# Patient Record
Sex: Male | Born: 1980 | Hispanic: Yes | Marital: Married
Health system: Southern US, Community
[De-identification: ages and names within clinical notes are randomized; demographics above are authoritative.]

---

## 2021-04-05 ENCOUNTER — Other Ambulatory Visit: Payer: Self-pay

## 2021-04-05 ENCOUNTER — Encounter (HOSPITAL_COMMUNITY): Payer: Self-pay | Admitting: Emergency Medicine

## 2021-04-05 ENCOUNTER — Emergency Department (HOSPITAL_COMMUNITY)
Admission: EM | Admit: 2021-04-05 | Discharge: 2021-04-06 | Disposition: A | Discharge status: Home / Self Care | Payer: Self-pay | Attending: Emergency Medicine | Admitting: Emergency Medicine

## 2021-04-05 DIAGNOSIS — R1013 Epigastric pain: Secondary | ICD-10-CM | POA: Insufficient documentation

## 2021-04-05 DIAGNOSIS — R109 Unspecified abdominal pain: Secondary | ICD-10-CM

## 2021-04-05 DIAGNOSIS — R1110 Vomiting, unspecified: Secondary | ICD-10-CM | POA: Insufficient documentation

## 2021-04-05 LAB — URINALYSIS, ROUTINE W REFLEX MICROSCOPIC
Bilirubin Urine: NEGATIVE
Glucose, UA: NEGATIVE mg/dL
Hgb urine dipstick: NEGATIVE
Ketones, ur: NEGATIVE mg/dL
Leukocytes,Ua: NEGATIVE
Nitrite: NEGATIVE
Protein, ur: NEGATIVE mg/dL
Specific Gravity, Urine: 1.016 (ref 1.005–1.030)
pH: 7 (ref 5.0–8.0)

## 2021-04-05 LAB — COMPREHENSIVE METABOLIC PANEL
ALT: 29 U/L (ref 0–44)
AST: 23 U/L (ref 15–41)
Albumin: 3.8 g/dL (ref 3.5–5.0)
Alkaline Phosphatase: 59 U/L (ref 38–126)
Anion gap: 7 (ref 5–15)
BUN: 10 mg/dL (ref 6–20)
CO2: 25 mmol/L (ref 22–32)
Calcium: 8.7 mg/dL — ABNORMAL LOW (ref 8.9–10.3)
Chloride: 104 mmol/L (ref 98–111)
Creatinine, Ser: 0.83 mg/dL (ref 0.61–1.24)
GFR, Estimated: >60 mL/min (ref >60)
Glucose, Bld: 104 mg/dL — ABNORMAL HIGH (ref 70–99)
Potassium: 3.5 mmol/L (ref 3.5–5.1)
Sodium: 136 mmol/L (ref 135–145)
Total Bilirubin: 0.4 mg/dL (ref 0.3–1.2)
Total Protein: 6.6 g/dL (ref 6.5–8.1)

## 2021-04-05 LAB — CBC
HCT: 46 % (ref 39.0–52.0)
Hemoglobin: 16.1 g/dL (ref 13.0–17.0)
MCH: 29.2 pg (ref 26.0–34.0)
MCHC: 35 g/dL (ref 30.0–36.0)
MCV: 83.5 fL (ref 80.0–100.0)
Platelets: 239 10*3/uL (ref 150–400)
RBC: 5.51 MIL/uL (ref 4.22–5.81)
RDW: 12.3 % (ref 11.5–15.5)
WBC: 7.2 10*3/uL (ref 4.0–10.5)
nRBC: 0 % (ref 0.0–0.2)

## 2021-04-05 LAB — LIPASE, BLOOD: Lipase: 50 U/L (ref 11–51)

## 2021-04-05 NOTE — ED Notes (Signed)
Called pt for vitals x1, no response 

## 2021-04-05 NOTE — ED Triage Notes (Signed)
Pt c/o epigastric pain/pressure x 1 week, with nausea and vomiting.

## 2021-04-06 ENCOUNTER — Emergency Department (HOSPITAL_COMMUNITY): Payer: Self-pay

## 2021-04-06 MED ORDER — SUCRALFATE 1 G PO TABS: 1.0000 g | ORAL_TABLET | Freq: Three times a day (TID) | ORAL | 0 refills | Status: AC

## 2021-04-06 MED ORDER — PANTOPRAZOLE SODIUM 40 MG PO TBEC: 40.0000 mg | DELAYED_RELEASE_TABLET | Freq: Every day | ORAL | Status: DC

## 2021-04-06 MED ORDER — LIDOCAINE VISCOUS HCL 2 % MT SOLN
15.0000 mL | Freq: Once | OROMUCOSAL | Status: AC
  Administered 2021-04-06: 15 mL via ORAL
  Filled 2021-04-06: qty 15

## 2021-04-06 MED ORDER — IOHEXOL 300 MG/ML  SOLN
100.0000 mL | Freq: Once | INTRAMUSCULAR | Status: AC | PRN
  Administered 2021-04-06: 100 mL via INTRAVENOUS

## 2021-04-06 MED ORDER — ONDANSETRON 4 MG PO TBDP
8.0000 mg | ORAL_TABLET | Freq: Once | ORAL | Status: AC
Start: 2021-04-06 — End: 2021-04-06
  Administered 2021-04-06: 8 mg via ORAL
  Filled 2021-04-06: qty 2

## 2021-04-06 MED ORDER — PANTOPRAZOLE SODIUM 20 MG PO TBEC: 20.0000 mg | DELAYED_RELEASE_TABLET | Freq: Every day | ORAL | 0 refills | Status: AC

## 2021-04-06 MED ORDER — ALUM & MAG HYDROXIDE-SIMETH 200-200-20 MG/5ML PO SUSP
30.0000 mL | Freq: Once | ORAL | Status: AC
  Administered 2021-04-06: 30 mL via ORAL
  Filled 2021-04-06: qty 30

## 2021-04-06 MED ORDER — FAMOTIDINE 20 MG PO TABS
20.0000 mg | ORAL_TABLET | Freq: Once | ORAL | Status: AC
Start: 2021-04-06 — End: 2021-04-06
  Administered 2021-04-06: 20 mg via ORAL
  Filled 2021-04-06: qty 1

## 2021-04-06 NOTE — ED Provider Notes (Signed)
[facility] Provider Note   CSN: [id number] Arrival date & time: 04/05/21  1906     History  Chief Complaint  Patient presents with   Abdominal Pain    Alex GaudierMario Gutierrez is a 41 y.o. male.  41 year old male that presents to the emergency department today with epigastric pain.  Patient states that this started a few days ago.  Had multiple episodes of nonbloody nonbilious emesis last night.  Felt worse and brought here for further evaluation.  Patient does not drink alcohol or smoke cigarettes.  No over-the-counter medications.  He has had an episode like this 1 time before [place] he [place] some type of medication there which made it better.  No recent trauma or infections.  No surgeries on his abdomen.  Does not radiate anywhere.  Not associate with any other symptoms.   Abdominal Pain     Home Medications Prior to Admission medications   Medication Sig Start Date End Date Taking? Authorizing Provider  OVER THE COUNTER MEDICATION Take 1 tablet by mouth daily. Neurobion- OTC b complex supplement   Yes [provider]  pantoprazole (PROTONIX) 20 MG tablet Take 1 tablet (20 mg total) by mouth daily. 04/06/21  Yes Delise Simenson, Barbara CowerJason, MD  sucralfate (CARAFATE) 1 g tablet Take 1 tablet (1 g total) by mouth 4 (four) times daily -  with meals and at bedtime for 7 days. 04/06/21 04/13/21 Yes Heinz Eckert, Barbara CowerJason, MD      Allergies    Patient has no known allergies.    Review of Systems   Review of Systems  Gastrointestinal:  Positive for abdominal pain.  All other systems reviewed and are negative.  Physical Exam Updated Vital Signs BP 125/81 (BP Location: Right Arm)    Pulse 71    Temp (!) 97.1 F (36.2 C) (Oral)    Resp (!) 22    SpO2 99%  Physical Exam Vitals and nursing note reviewed.  Constitutional:      Appearance: He is well-developed.  HENT:     Head: Normocephalic and atraumatic.  Cardiovascular:     Rate and Rhythm: Normal rate.   Pulmonary:     Effort: Pulmonary effort is normal. No respiratory distress.  Abdominal:     General: There is no distension.  Musculoskeletal:        General: Normal range of motion.     Cervical back: Normal range of motion.  Skin:    General: Skin is warm and dry.  Neurological:     General: No focal deficit present.     Mental Status: He is alert.    ED Results / Procedures / Treatments   Labs (all labs ordered are listed, but only abnormal results are displayed) Labs Reviewed  COMPREHENSIVE METABOLIC PANEL - Abnormal; Notable for the following components:      Result Value   Glucose, Bld 104 (*)    Calcium 8.7 (*)    All other components within normal limits  LIPASE, BLOOD  CBC  URINALYSIS, ROUTINE W REFLEX MICROSCOPIC    EKG EKG Interpretation  Date/Time:  Saturday April 05 2021 19:21:27 EST Ventricular Rate:  78 PR Interval:  160 QRS Duration: 82 QT Interval:  366 QTC Calculation: 417 R Axis:   78 Text Interpretation: Normal sinus rhythm Septal infarct , age undetermined Abnormal ECG No previous ECGs available Confirmed by Marily MemosMesner, Kayode Petion [phone]([id number]) on 04/06/2021 5:51:38 AM  Radiology CT ABDOMEN PELVIS W CONTRAST  Result Date: 04/06/2021 CLINICAL DATA:  Evaluate for pancreatitis. Nonlocalized epigastric pain. EXAM: CT ABDOMEN AND PELVIS WITH CONTRAST TECHNIQUE: Multidetector CT imaging of the abdomen and pelvis was performed using the standard protocol following bolus administration of intravenous contrast. RADIATION DOSE REDUCTION: This exam was performed according to the departmental dose-optimization program which includes automated exposure control, adjustment of the mA and/or kV according to patient size and/or use of iterative reconstruction technique. CONTRAST:  OMNIPAQUE IOHEXOL 300 MG/ML  SOLN COMPARISON:  None. FINDINGS: Lower chest: No acute abnormality. Hepatobiliary: No focal liver abnormality is seen. No gallstones, gallbladder wall thickening, or  biliary dilatation. Pancreas: Unremarkable. No pancreatic ductal dilatation or surrounding inflammatory changes. Spleen: Normal in size without focal abnormality. Adrenals/Urinary Tract: Adrenal glands are unremarkable. Kidneys are normal, without renal calculi, focal lesion, or hydronephrosis. Bladder is unremarkable. Stomach/Bowel: Stomach is within normal limits. Appendix appears normal. No evidence of bowel wall thickening, distention, or inflammatory changes. Vascular/Lymphatic: No significant vascular findings are present. No enlarged abdominal or pelvic lymph nodes. Reproductive: Prostate is unremarkable. Other: No abdominal wall hernia or abnormality. No abdominopelvic ascites. Musculoskeletal: No acute or significant osseous findings. IMPRESSION: No acute findings within the abdomen or pelvis. Electronically Signed   By: Signa Kell M.D.   On: 04/06/2021 05:25    Procedures Procedures    Medications Ordered in ED Medications  pantoprazole (PROTONIX) EC tablet 40 mg (has no administration in time range)  alum & mag hydroxide-simeth (MAALOX/MYLANTA) 200-200-20 MG/5ML suspension 30 mL (30 mLs Oral Given 04/06/21 0233)    And  lidocaine (XYLOCAINE) 2 % viscous mouth solution 15 mL (15 mLs Oral Given 04/06/21 0233)  famotidine (PEPCID) tablet 20 mg (20 mg Oral Given 04/06/21 0232)  ondansetron (ZOFRAN-ODT) disintegrating tablet 8 mg (8 mg Oral Given 04/06/21 0232)  iohexol (OMNIPAQUE) 300 MG/ML solution 100 mL (100 mLs Intravenous Contrast Given 04/06/21 0448)    ED Course/ Medical Decision Making/ A&P                           Medical Decision Making Amount and/or Complexity of Data Reviewed Labs: ordered. Radiology: ordered.  Risk OTC drugs. Prescription drug management.   41 year old male with epigastric pain.  Low suspicion for cardiac disease without any other associated symptoms and his young age without risk factors.  His lipase is 50 which is unremarkable I think pancreatitis  is unlikely.  His liver enzymes are within normal limits I doubt he has cholecystitis or other biliary pathology.  I think this is probably gastritis/esophagitis or some other form of GERD.  We will treat symptomatically and reevaluate for patient.  At this time I do not have any indication for CT scan.  Patient had minimal improvement with initial medications even after an hour of observation so I decided to CT scan which was unremarkable.  After this patient stated he felt better.  We will suspect he has gastritis as the cause for symptoms.  Appears to be stable for discharge at this time with PCP follow-up \ Final Clinical Impression(s) / ED Diagnoses Final diagnoses:  Abdominal pain, unspecified abdominal location    Rx / DC Orders ED Discharge Orders          Ordered    pantoprazole (PROTONIX) 20 MG tablet  Daily        04/06/21 0559    sucralfate (CARAFATE) 1 g tablet  3 times daily with meals & bedtime        04/06/21 0559  Andrew Soria, Barbara Cower, MD 04/06/21 [phone]

## 2023-02-18 IMAGING — CT CT ABD-PELV W/ CM
2 of 4 series · 16 of 46 positions shown, 18 images · IV contrast (agent unspecified)
Comparison: None.

CLINICAL DATA: Evaluate for pancreatitis. Nonlocalized epigastric
pain.

EXAM:
CT ABDOMEN AND PELVIS WITH CONTRAST
TECHNIQUE: Multidetector CT imaging of the abdomen and pelvis was performed
using the standard protocol following bolus administration of
intravenous contrast.

[Series 3: a/p w/ 5mm · axial · 0.88mm/px · z∈[+723,+1143]mm · 13 of 92 slices shown, 15 images]
[im 4/92  soft-tissue]
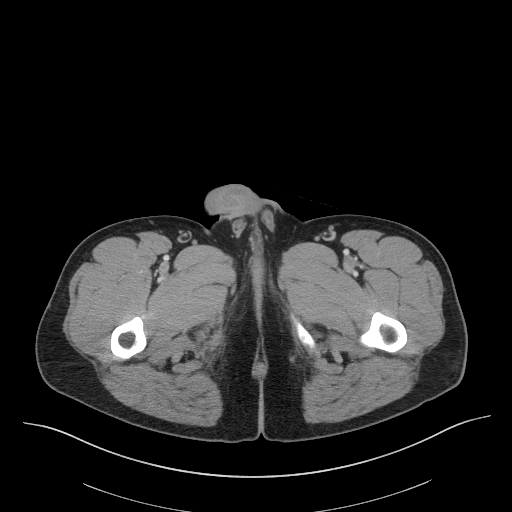
[im 4/92  bone]
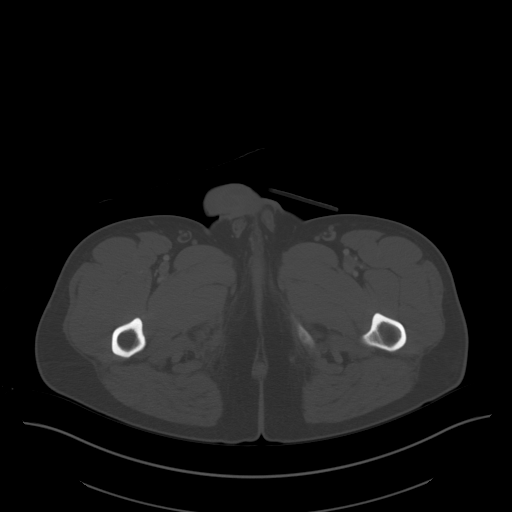
[im 12/92  soft-tissue]
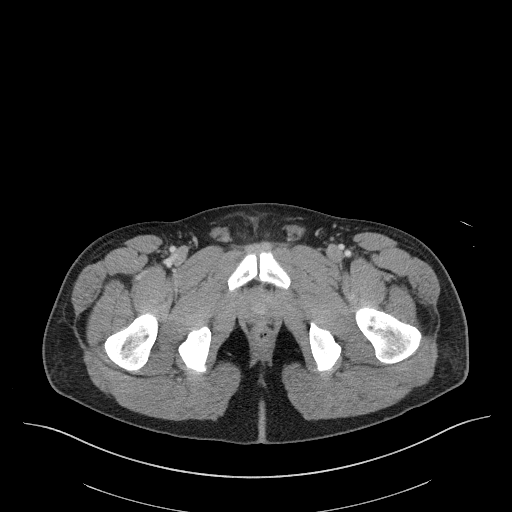
[im 19/92  soft-tissue]
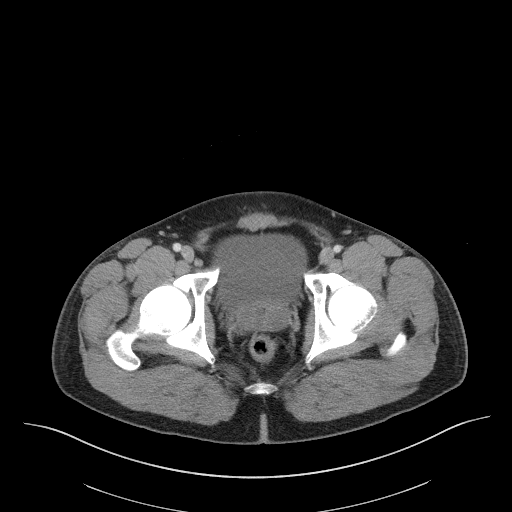
[im 27/92  soft-tissue]
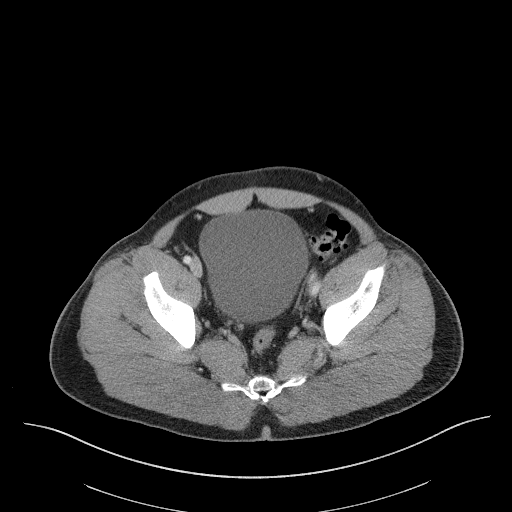
[im 31/92  soft-tissue]
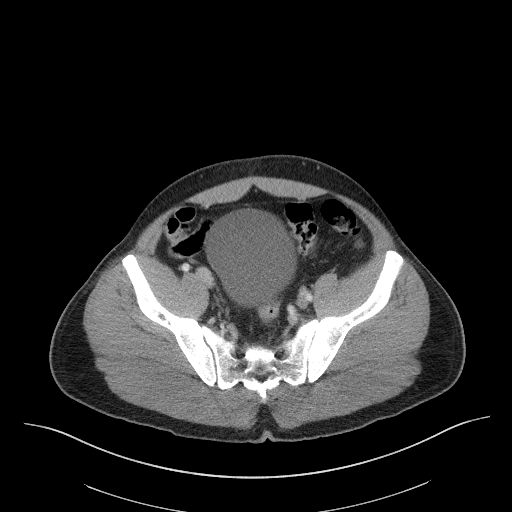
[im 38/92  soft-tissue]
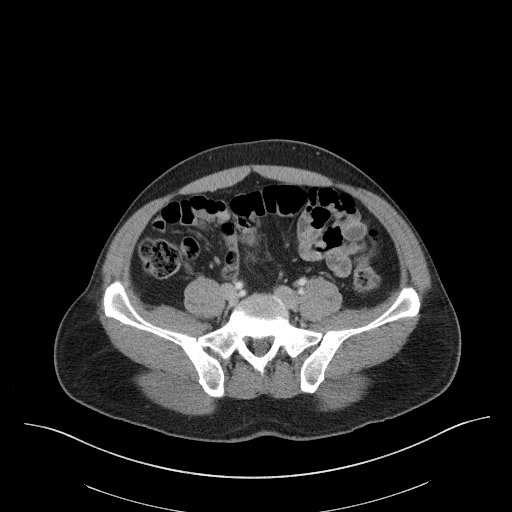
[im 46/92  soft-tissue]
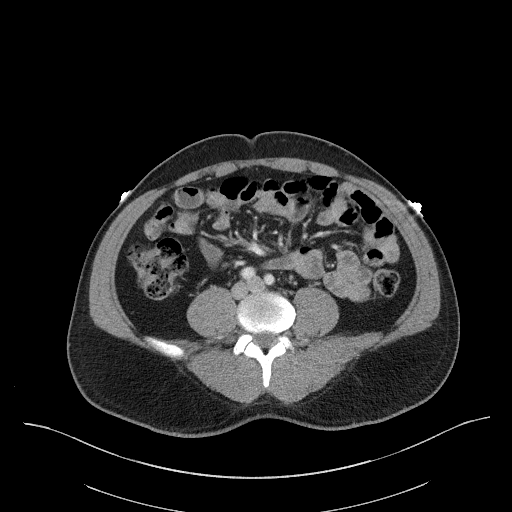
[im 54/92  soft-tissue]
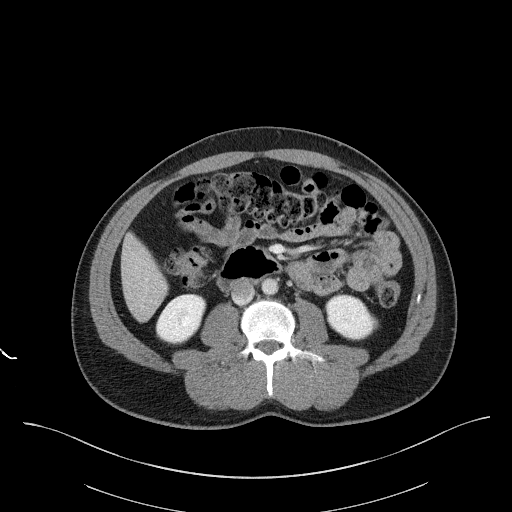
[im 61/92  soft-tissue]
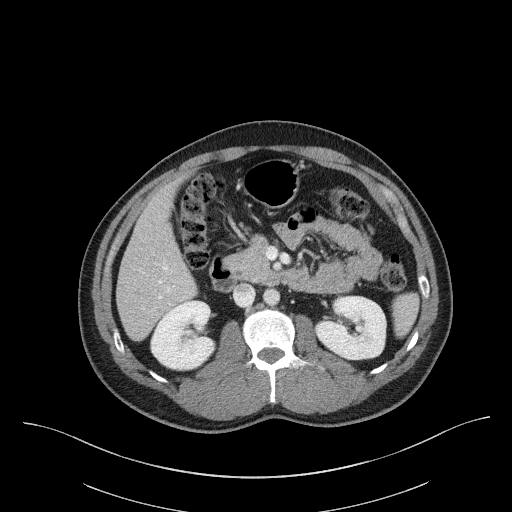
[im 61/92  bone]
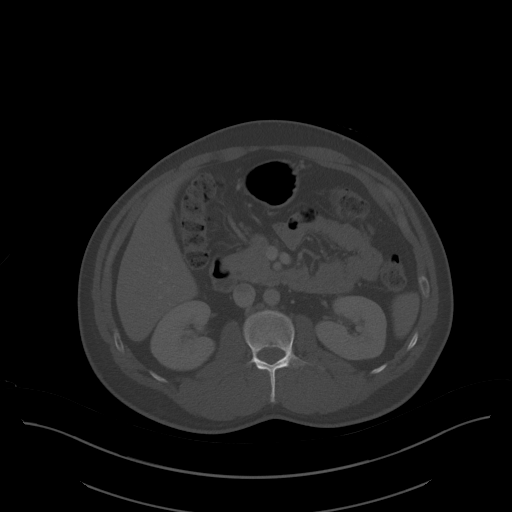
[im 65/92  soft-tissue]
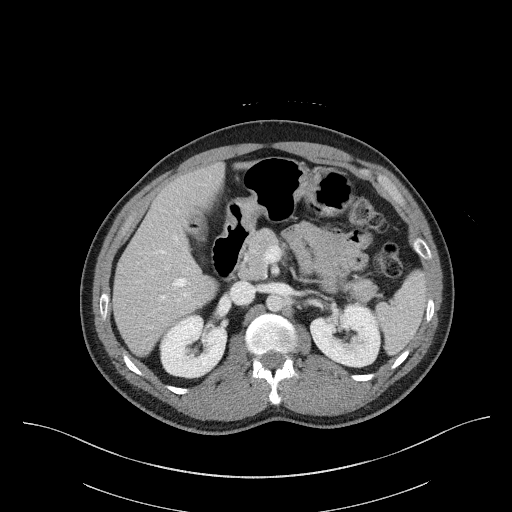
[im 73/92  soft-tissue]
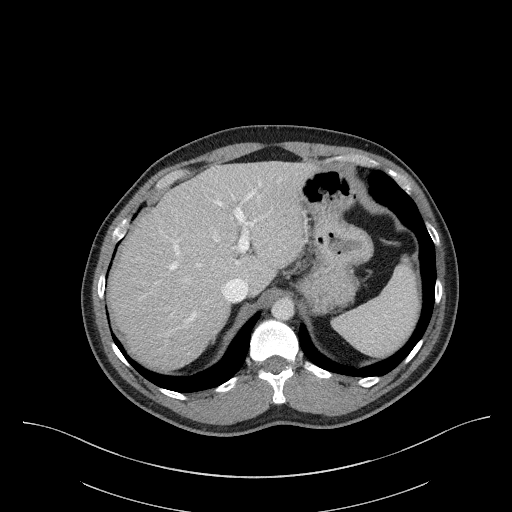
[im 80/92  soft-tissue]
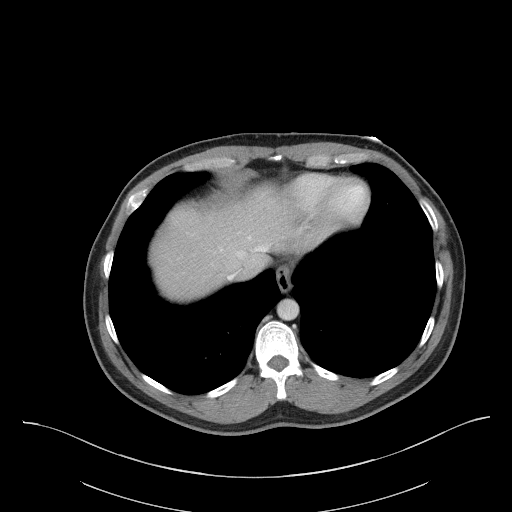
[im 88/92  soft-tissue]
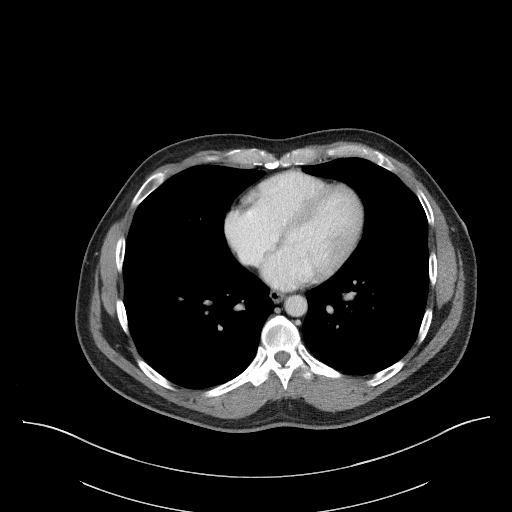

[Series 6: a/p w/ cor · coronal · 0.78mm/px · 3 of 150 slices shown]
[im 50/150  soft-tissue]
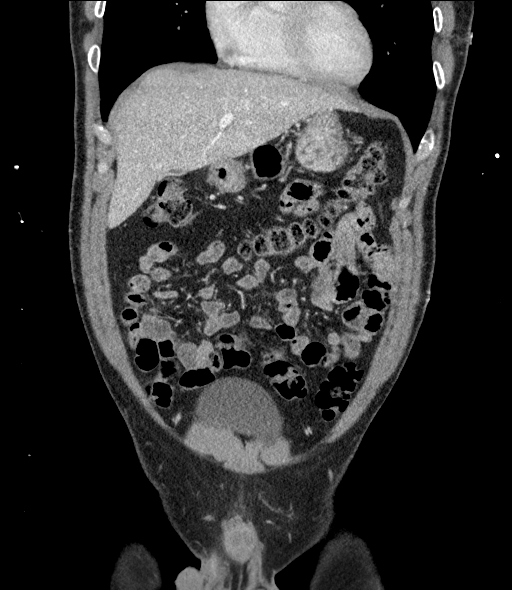
[im 67/150  soft-tissue]
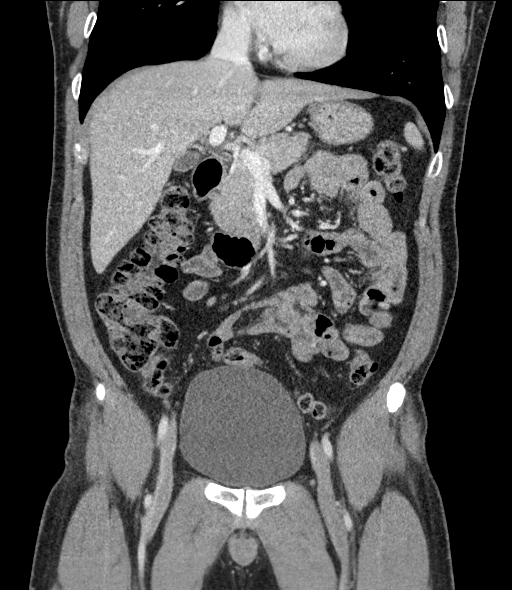
[im 83/150  soft-tissue]
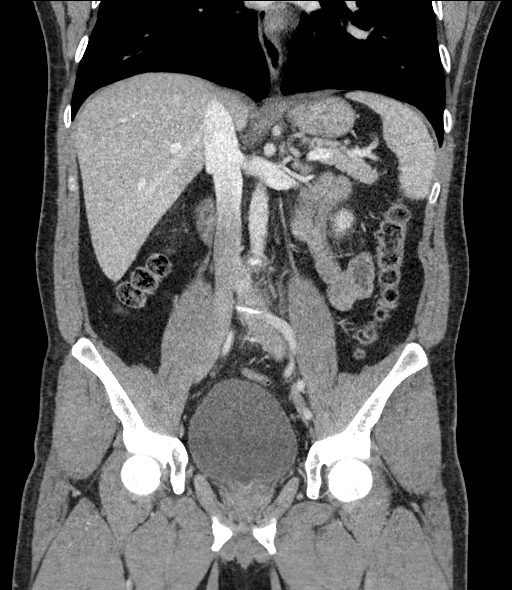

[16 of 46 positions shown; findings below may reference images not displayed]

RADIATION DOSE REDUCTION: This exam was performed according to the
departmental dose-optimization program which includes automated
exposure control, adjustment of the mA and/or kV according to
patient size and/or use of iterative reconstruction technique.

CONTRAST:  100mL OMNIPAQUE IOHEXOL 300 MG/ML  SOLN
FINDINGS: Lower chest: No acute abnormality.

Hepatobiliary: No focal liver abnormality is seen. No gallstones,
gallbladder wall thickening, or biliary dilatation.

Pancreas: Unremarkable. No pancreatic ductal dilatation or
surrounding inflammatory changes.

Spleen: Normal in size without focal abnormality.

Adrenals/Urinary Tract: Adrenal glands are unremarkable. Kidneys are
normal, without renal calculi, focal lesion, or hydronephrosis.
Bladder is unremarkable.

Stomach/Bowel: Stomach is within normal limits. Appendix appears
normal. No evidence of bowel wall thickening, distention, or
inflammatory changes.

Vascular/Lymphatic: No significant vascular findings are present. No
enlarged abdominal or pelvic lymph nodes.

Reproductive: Prostate is unremarkable.

Other: No abdominal wall hernia or abnormality. No abdominopelvic
ascites.

Musculoskeletal: No acute or significant osseous findings.
IMPRESSION: No acute findings within the abdomen or pelvis.
